# Patient Record
Sex: Female | Born: 1995 | Race: Black or African American | Hispanic: No | Marital: Single | Smoking: Never smoker
Health system: Southern US, Community
[De-identification: ages and names within clinical notes are randomized; demographics above are authoritative.]

## PROBLEM LIST (undated history)

## (undated) DIAGNOSIS — F419 Anxiety disorder, unspecified: Secondary | ICD-10-CM

---

## 2017-11-25 ENCOUNTER — Other Ambulatory Visit: Payer: Self-pay

## 2017-11-25 ENCOUNTER — Encounter (HOSPITAL_COMMUNITY): Payer: Self-pay | Admitting: Emergency Medicine

## 2017-11-25 ENCOUNTER — Ambulatory Visit (INDEPENDENT_AMBULATORY_CARE_PROVIDER_SITE_OTHER): Payer: Self-pay

## 2017-11-25 ENCOUNTER — Ambulatory Visit (HOSPITAL_COMMUNITY)
Admission: EM | Admit: 2017-11-25 | Discharge: 2017-11-25 | Disposition: A | Discharge status: Home / Self Care | Payer: Self-pay | Attending: Family Medicine | Admitting: Family Medicine

## 2017-11-25 DIAGNOSIS — R0789 Other chest pain: Secondary | ICD-10-CM

## 2017-11-25 DIAGNOSIS — F419 Anxiety disorder, unspecified: Secondary | ICD-10-CM

## 2017-11-25 HISTORY — DX: Anxiety disorder, unspecified: F41.9

## 2017-11-25 MED ORDER — ALBUTEROL SULFATE HFA 108 (90 BASE) MCG/ACT IN AERS
2.0000 | INHALATION_SPRAY | Freq: Once | RESPIRATORY_TRACT | Status: AC
  Administered 2017-11-25: 2 via RESPIRATORY_TRACT

## 2017-11-25 MED ORDER — HYDROXYZINE HCL 25 MG PO TABS: 25.0000 mg | ORAL_TABLET | Freq: Every evening | ORAL | 0 refills | Status: AC | PRN

## 2017-11-25 MED ORDER — IBUPROFEN 800 MG PO TABS
800.0000 mg | ORAL_TABLET | Freq: Once | ORAL | Status: AC
  Administered 2017-11-25: 800 mg via ORAL

## 2017-11-25 MED ORDER — ALBUTEROL SULFATE HFA 108 (90 BASE) MCG/ACT IN AERS
INHALATION_SPRAY | RESPIRATORY_TRACT | Status: AC
  Filled 2017-11-25: qty 6.7

## 2017-11-25 MED ORDER — IBUPROFEN 800 MG PO TABS: 800.0000 mg | ORAL_TABLET | Freq: Three times a day (TID) | ORAL | 0 refills | Status: AC

## 2017-11-25 MED ORDER — IBUPROFEN 800 MG PO TABS
ORAL_TABLET | ORAL | Status: AC
  Filled 2017-11-25: qty 1

## 2017-11-25 NOTE — ED Triage Notes (Signed)
The patient presented to the [facility] with a complaint of chest pain x 4 days. The patient described the pain as a pressure and tightness that was a 8/10. The patient stated that the pain does not change with palpation and nothing increases or decreases the pain. The patient denied and N/V. The patient stated that if she "leans or stretches: then it increases the pain.

## 2017-11-25 NOTE — Discharge Instructions (Signed)
Your ekg and chest xray are normal today. We can try a few things to try to help with your chest tightness.  Albuterol inhaler every 4-6 hours as needed for tightness, wheezing or shortness of breath. Ibuprofen 3 times a day, take with food. Hydroxyzine at night may be helpful with anxiety which may help with your symptoms.  Please continue to follow up with your primary care provider for recheck of symptoms in the next 1-2 weeks. If develop fevers, chest pain, shortness of breathing, dizziness/lightheadedness, or otherwise worsening please return to be seen or go to Er.

## 2017-11-25 NOTE — ED Provider Notes (Signed)
[facility]    CSN: [id number] Arrival date & time: 11/25/17  1151     History   Chief Complaint Chief Complaint  Patient presents with  . Chest Pain    HPI Brietta Manso is a 22 y.o. female.   Florestine presents with family with complaints of central chest tightness which started 3-4 days ago and has been constant. States she had increased stress a few weeks ago and had mild symptoms but did not persist. Denies any current obvious increased stressors. Denies cough, fever, shortness of breath , uri symptoms, asthma, or smoking. Has not taken any medications for symptoms. History of anxiety. She did ride in a bus from [place] to [place] last week. Denies leg pain or swelling. She is not on any oral hormone therapy. No cancer hx. No specific known exacerbating or relieving factors, it does mildly worsen with activity or movement of her torso.   ROS per HPI.      Past Medical History:  Diagnosis Date  . Anxiety     There are no active problems to display for this patient.   History reviewed. No pertinent surgical history.  OB History   None      Home Medications    Prior to Admission medications   Medication Sig Start Date End Date Taking? Authorizing Provider  hydrOXYzine (ATARAX/VISTARIL) 25 MG tablet Take 1 tablet (25 mg total) by mouth at bedtime as needed for anxiety. 11/25/17   Georgetta Haber, NP  ibuprofen (ADVIL,MOTRIN) 800 MG tablet Take 1 tablet (800 mg total) by mouth 3 (three) times daily. 11/25/17   Georgetta Haber, NP    Family History History reviewed. No pertinent family history.  Social History Social History   Tobacco Use  . Smoking status: Never Smoker  . Smokeless tobacco: Never Used  Substance Use Topics  . Alcohol use: Yes    Comment: Occ  . Drug use: Never     Allergies   Patient has no known allergies.   Review of Systems Review of Systems   Physical Exam Triage Vital Signs ED Triage Vitals  Enc Vitals Group   BP 11/25/17 1236 132/68     Pulse Rate 11/25/17 1236 69     Resp 11/25/17 1236 18     Temp 11/25/17 1236 98.9 F (37.2 C)     Temp Source 11/25/17 1236 Oral     SpO2 11/25/17 1236 100 %     Weight --      Height --      Head Circumference --      Peak Flow --      Pain Score 11/25/17 1234 8     Pain Loc --      Pain Edu? --      Excl. in GC? --    No data found.  Updated Vital Signs BP 132/68 (BP Location: Left Arm)   Pulse 69   Temp 98.9 F (37.2 C) (Oral)   Resp 18   LMP 11/13/2017 (Exact Date)   SpO2 100%   Physical Exam  Constitutional: She is oriented to person, place, and time. She appears well-developed and well-nourished. No distress.  HENT:  Head: Normocephalic and atraumatic.  Eyes: Pupils are equal, round, and reactive to light.  Neck: Normal range of motion.  Cardiovascular: Normal rate, regular rhythm, normal heart sounds and normal pulses.  Pulmonary/Chest: Effort normal and breath sounds normal. No accessory muscle usage. No respiratory distress. She has no decreased breath sounds.  Abdominal: Soft.  Neurological: She is alert and oriented to person, place, and time.  Skin: Skin is warm and dry.  Vitals reviewed.  EKG NSR rate 80 without acute changes.   UC Treatments / Results  Labs (all labs ordered are listed, but only abnormal results are displayed) Labs Reviewed - No data to display  EKG None  Radiology Dg Chest 2 View  Result Date: 11/25/2017 CLINICAL DATA:  Chest tightness, pain for 3 days EXAM: CHEST - 2 VIEW COMPARISON:  None. FINDINGS: Heart and mediastinal contours are within normal limits. No focal opacities or effusions. No acute bony abnormality. IMPRESSION: No active cardiopulmonary disease. Electronically Signed   By: Charlett Nose M.D.   On: 11/25/2017 13:06    Procedures Procedures (including critical care time)  Medications Ordered in UC Medications  albuterol (PROVENTIL HFA;VENTOLIN HFA) 108 (90 Base) MCG/ACT inhaler 2  puff (has no administration in time range)  ibuprofen (ADVIL,MOTRIN) tablet 800 mg (has no administration in time range)    Initial Impression / Assessment and Plan / UC Course  I have reviewed the triage vital signs and the nursing notes.  Pertinent labs & imaging results that were available during my care of the patient were reviewed by me and considered in my medical decision making (see chart for details).     Non toxic in appearance. Without acute findings on exam. ekg and chest xray without acute findings. Afebrile. Without increased work of breathing, shortness of breath , tachpnea, tachycardia, hypoxia. No cardiac hx. Besides recent drive no other PE risk factors. Discussed anxiety vs costochondritis vs pleurisy vs bronchospasm. Albuterol and ibuprofen provided in clinic today. Family member feels that anxiety is likely contributing. Vistaril at night prn. Encouraged follow up with pcp for recheck and further management. Return precautions provided. Patient verbalized understanding and agreeable to plan.    Final Clinical Impressions(s) / UC Diagnoses   Final diagnoses:  Chest tightness  Anxiety     Discharge Instructions     Your ekg and chest xray are normal today. We can try a few things to try to help with your chest tightness.  Albuterol inhaler every 4-6 hours as needed for tightness, wheezing or shortness of breath. Ibuprofen 3 times a day, take with food. Hydroxyzine at night may be helpful with anxiety which may help with your symptoms.  Please continue to follow up with your primary care provider for recheck of symptoms in the next 1-2 weeks. If develop fevers, chest pain, shortness of breathing, dizziness/lightheadedness, or otherwise worsening please return to be seen or go to Er.    ED Prescriptions    Medication Sig Dispense Auth. Provider   ibuprofen (ADVIL,MOTRIN) 800 MG tablet Take 1 tablet (800 mg total) by mouth 3 (three) times daily. 21 tablet Linus Mako B, NP   hydrOXYzine (ATARAX/VISTARIL) 25 MG tablet Take 1 tablet (25 mg total) by mouth at bedtime as needed for anxiety. 12 tablet Georgetta Haber, NP     Controlled Substance Prescriptions [place] Controlled Substance Registry consulted? Not Applicable   Georgetta Haber, NP 11/25/17 1332

## 2019-08-02 IMAGING — DX DG CHEST 2V
2 series · 2 of 2 positions shown · non-contrast
Comparison: None.

CLINICAL DATA: Chest tightness, pain for 3 days

EXAM:
CHEST - 2 VIEW

[chest pa]
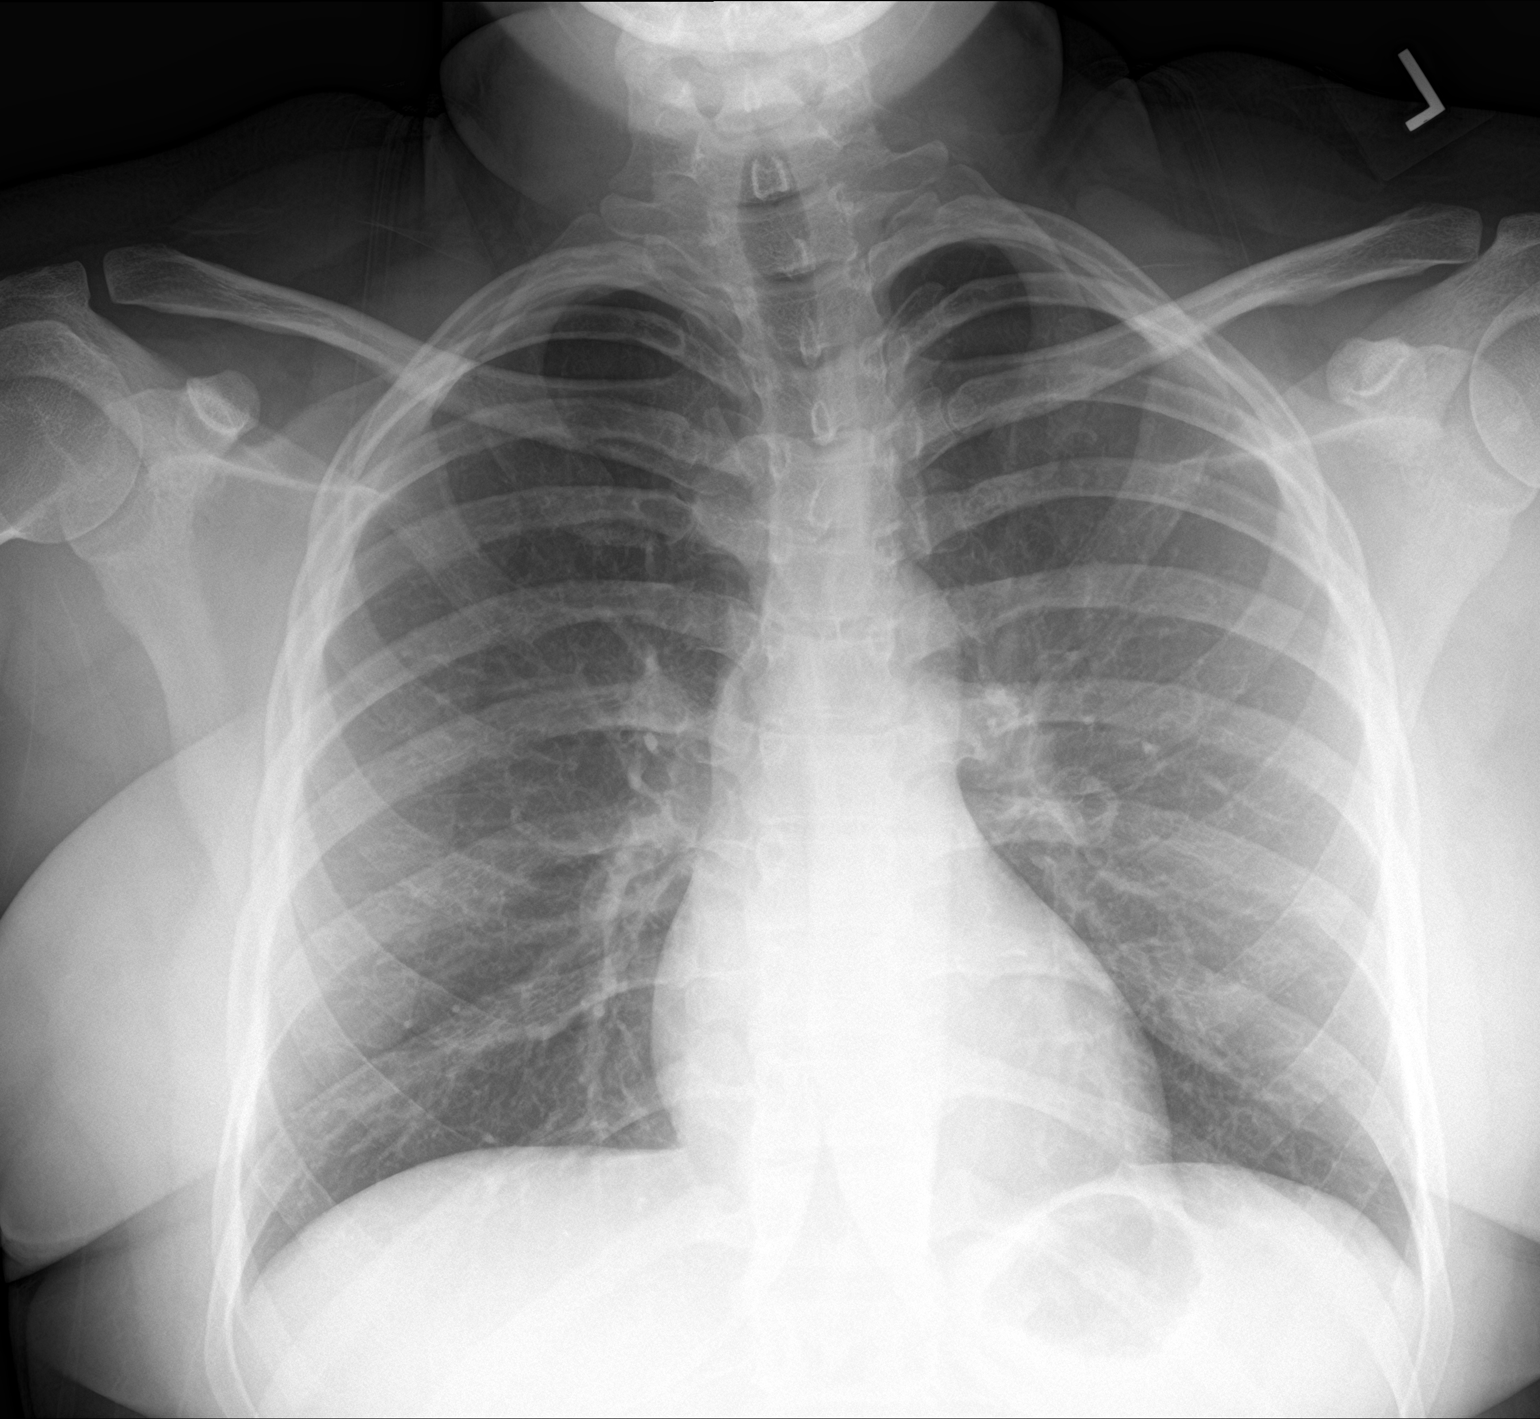

[chest lat]
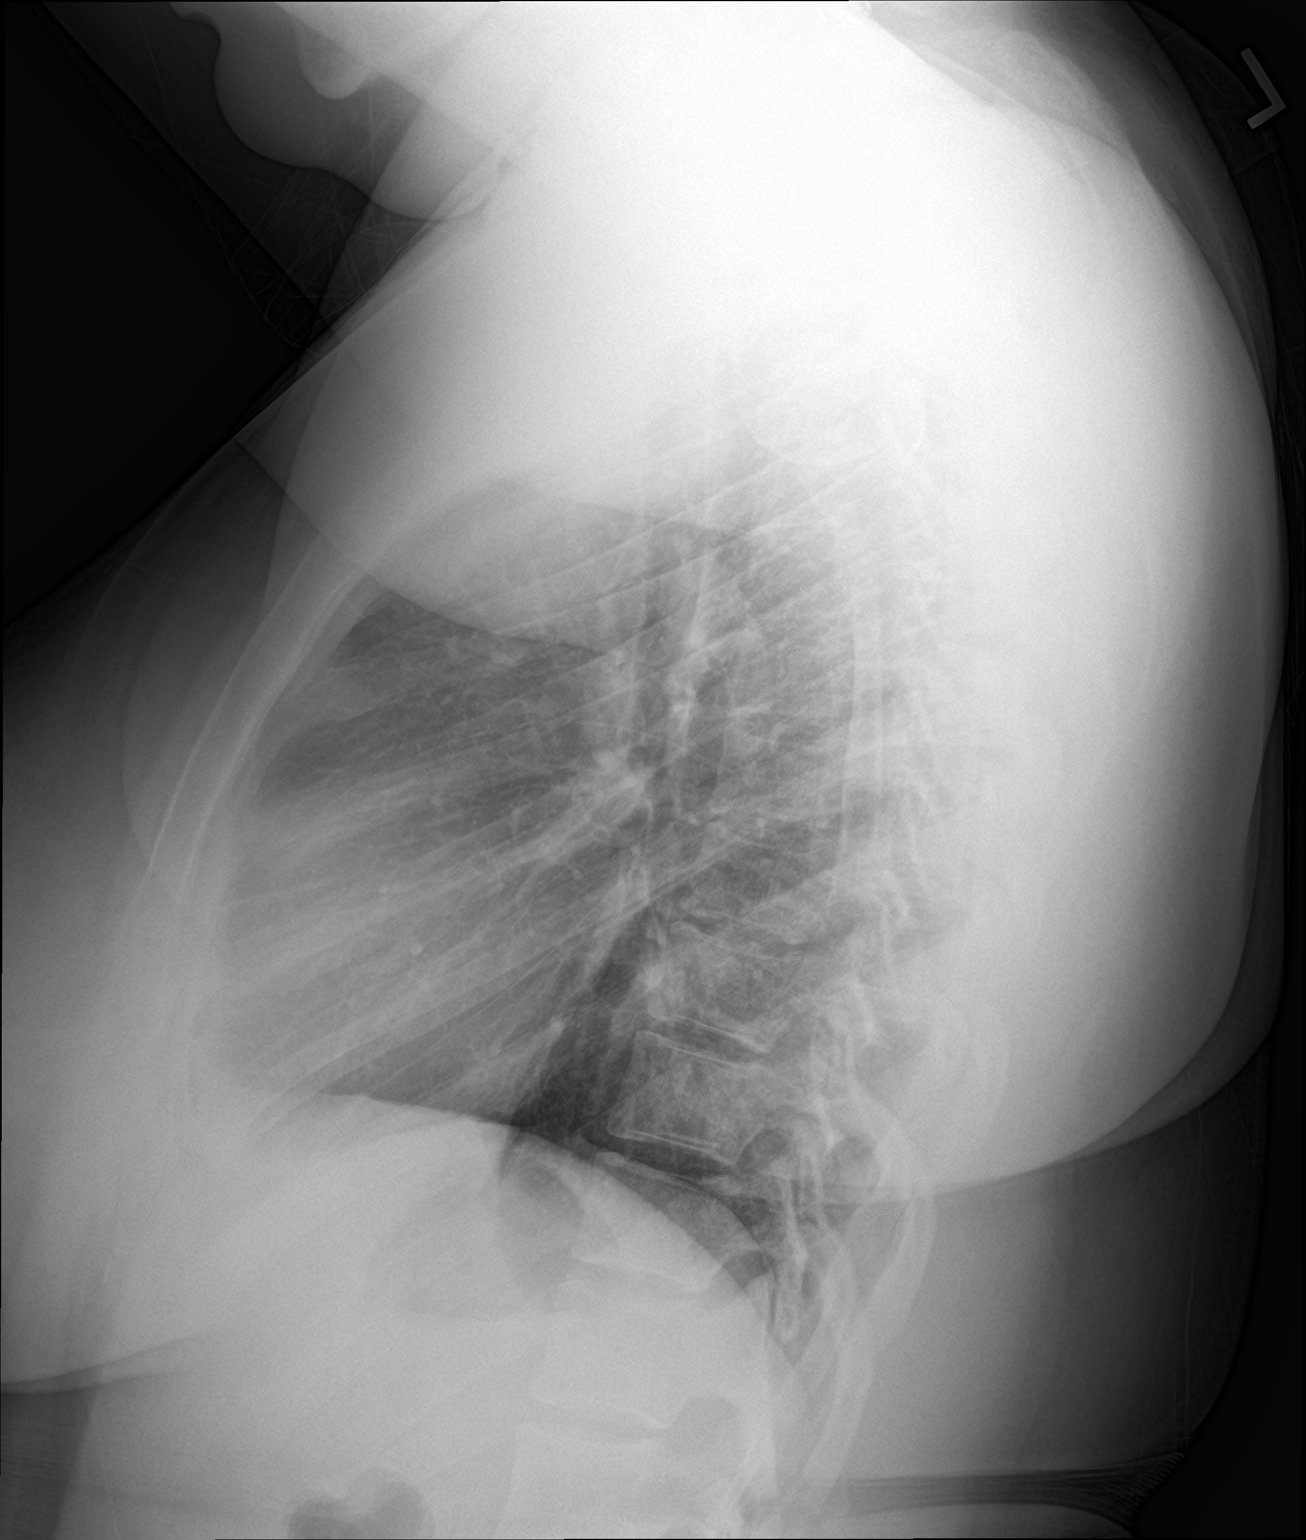

[2 of 2 positions shown; findings below may reference images not displayed]

FINDINGS: Heart and mediastinal contours are within normal limits. No focal
opacities or effusions. No acute bony abnormality.
IMPRESSION: No active cardiopulmonary disease.
